# Patient Record
Sex: Female | Born: 1985 | Race: White | Hispanic: No | Marital: Single | Smoking: Current every day smoker
Health system: Southern US, Community
[De-identification: ages and names within clinical notes are randomized; demographics above are authoritative.]

## PROBLEM LIST (undated history)

## (undated) DIAGNOSIS — M797 Fibromyalgia: Secondary | ICD-10-CM

## (undated) DIAGNOSIS — J40 Bronchitis, not specified as acute or chronic: Secondary | ICD-10-CM

## (undated) HISTORY — PX: FACIAL FRACTURE SURGERY: SHX1570

---
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
## 2008-04-21 ENCOUNTER — Emergency Department (HOSPITAL_COMMUNITY): Admission: EM | Admit: 2008-04-21 | Discharge: 2008-04-21 | Payer: Self-pay | Admitting: Emergency Medicine

## 2008-12-06 ENCOUNTER — Emergency Department (HOSPITAL_COMMUNITY): Admission: EM | Admit: 2008-12-06 | Discharge: 2008-12-06 | Payer: Self-pay | Admitting: Emergency Medicine

## 2009-03-26 ENCOUNTER — Emergency Department (HOSPITAL_COMMUNITY): Admission: EM | Admit: 2009-03-26 | Discharge: 2009-03-26 | Payer: Self-pay | Admitting: Emergency Medicine

## 2009-03-28 ENCOUNTER — Emergency Department (HOSPITAL_COMMUNITY): Admission: EM | Admit: 2009-03-28 | Discharge: 2009-03-28 | Payer: Self-pay | Admitting: Emergency Medicine

## 2009-03-29 ENCOUNTER — Emergency Department (HOSPITAL_COMMUNITY): Admission: EM | Admit: 2009-03-29 | Discharge: 2009-03-29 | Payer: Self-pay | Admitting: Emergency Medicine

## 2009-07-30 ENCOUNTER — Emergency Department (HOSPITAL_COMMUNITY): Admission: EM | Admit: 2009-07-30 | Discharge: 2009-07-30 | Payer: Self-pay | Admitting: Emergency Medicine

## 2009-08-12 ENCOUNTER — Emergency Department (HOSPITAL_COMMUNITY): Admission: EM | Admit: 2009-08-12 | Discharge: 2009-08-12 | Payer: Self-pay | Admitting: Emergency Medicine

## 2009-10-08 ENCOUNTER — Emergency Department (HOSPITAL_COMMUNITY): Admission: EM | Admit: 2009-10-08 | Discharge: 2009-10-08 | Payer: Self-pay | Admitting: Emergency Medicine

## 2010-11-24 IMAGING — CR DG CHEST 2V
2 series · 2 of 2 positions shown · non-contrast
Comparison: None

CLINICAL DATA: Cough/chest pain the

CHEST - 2 VIEW

[w chest pa]
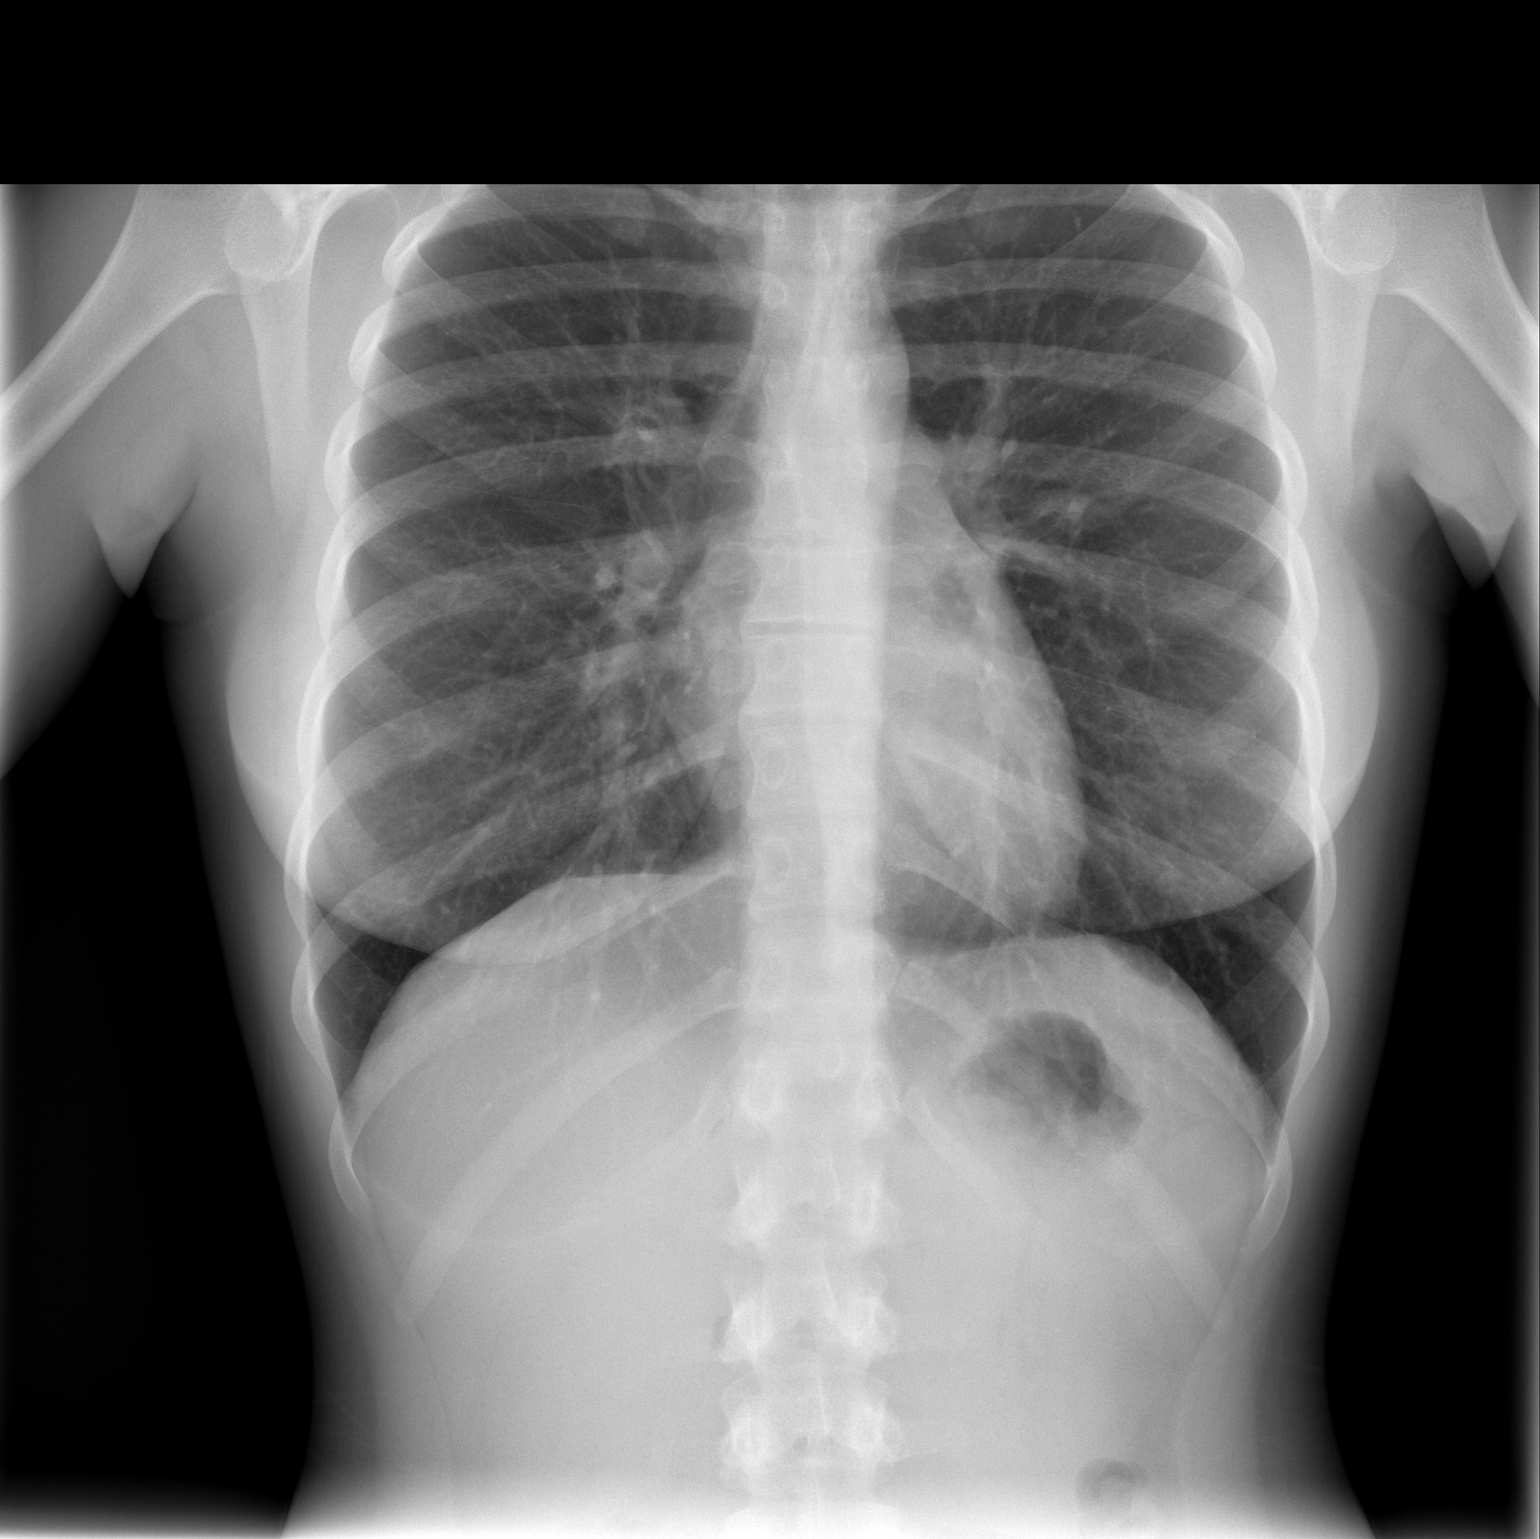

[w chest lat]
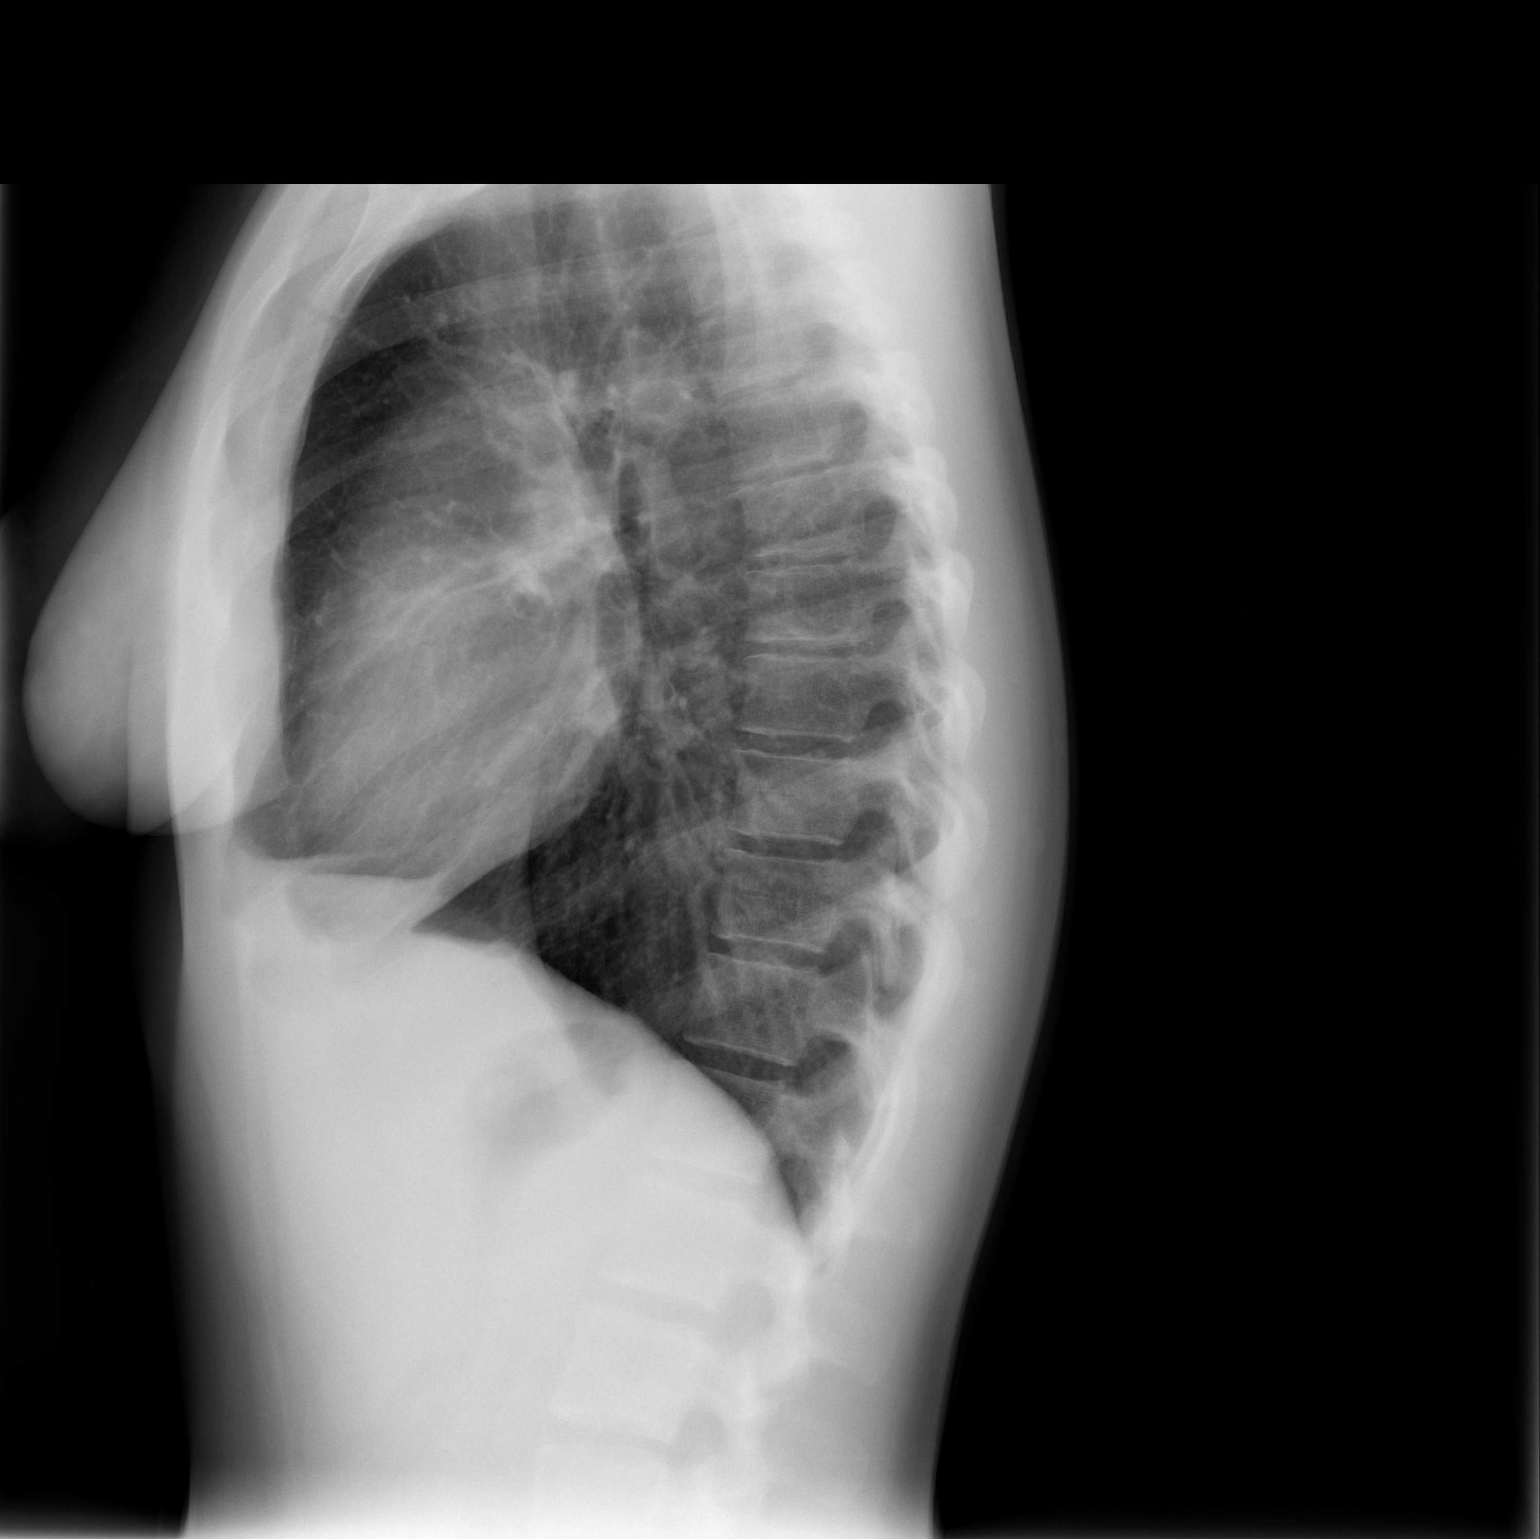

[2 of 2 positions shown; findings below may reference images not displayed]

FINDINGS: Heart and mediastinal contours normal.  Lungs clear.  No
pleural fluid.  Osseous structures and soft tissues unremarkable.
IMPRESSION: No acute or significant findings.

## 2010-12-09 ENCOUNTER — Emergency Department (HOSPITAL_COMMUNITY)
Admission: EM | Admit: 2010-12-09 | Discharge: 2010-12-09 | Payer: Self-pay | Source: Home / Self Care | Admitting: Emergency Medicine

## 2011-02-06 ENCOUNTER — Emergency Department (HOSPITAL_COMMUNITY)
Admission: EM | Admit: 2011-02-06 | Discharge: 2011-02-07 | Disposition: A | Discharge status: Other Acute Inpatient Hospital | Payer: Self-pay | Attending: Emergency Medicine | Admitting: Emergency Medicine

## 2011-02-06 DIAGNOSIS — F329 Major depressive disorder, single episode, unspecified: Secondary | ICD-10-CM | POA: Insufficient documentation

## 2011-02-06 DIAGNOSIS — F1910 Other psychoactive substance abuse, uncomplicated: Secondary | ICD-10-CM | POA: Insufficient documentation

## 2011-02-06 DIAGNOSIS — F3289 Other specified depressive episodes: Secondary | ICD-10-CM | POA: Insufficient documentation

## 2011-02-06 DIAGNOSIS — F1210 Cannabis abuse, uncomplicated: Secondary | ICD-10-CM | POA: Insufficient documentation

## 2011-02-07 ENCOUNTER — Inpatient Hospital Stay (HOSPITAL_COMMUNITY)
Admission: RE | Admit: 2011-02-07 | Discharge: 2011-02-10 | DRG: 885 | Disposition: A | Discharge status: Home / Self Care | Payer: PRIVATE HEALTH INSURANCE | Source: Ambulatory Visit | Attending: Psychiatry | Admitting: Psychiatry

## 2011-02-07 DIAGNOSIS — Z818 Family history of other mental and behavioral disorders: Secondary | ICD-10-CM

## 2011-02-07 DIAGNOSIS — J42 Unspecified chronic bronchitis: Secondary | ICD-10-CM

## 2011-02-07 DIAGNOSIS — F39 Unspecified mood [affective] disorder: Secondary | ICD-10-CM

## 2011-02-07 DIAGNOSIS — Z560 Unemployment, unspecified: Secondary | ICD-10-CM

## 2011-02-07 LAB — COMPREHENSIVE METABOLIC PANEL
ALT: 12 U/L (ref 0–35)
AST: 19 U/L (ref 0–37)
Albumin: 4.5 g/dL (ref 3.5–5.2)
CO2: 24 mEq/L (ref 19–32)
Calcium: 9.6 mg/dL (ref 8.4–10.5)
Chloride: 103 mEq/L (ref 96–112)
Creatinine, Ser: 0.82 mg/dL (ref 0.4–1.2)
GFR calc Af Amer: >60 mL/min (ref >60)
GFR calc non Af Amer: >60 mL/min (ref >60)
Sodium: 137 mEq/L (ref 135–145)

## 2011-02-07 LAB — CBC
Hemoglobin: 15 g/dL (ref 12.0–15.0)
MCHC: 34.2 g/dL (ref 30.0–36.0)
Platelets: 258 10*3/uL (ref 150–400)
RBC: 4.72 MIL/uL (ref 3.87–5.11)

## 2011-02-07 LAB — DIFFERENTIAL
Basophils Relative: 0 % (ref 0–1)
Eosinophils Absolute: 0.2 10*3/uL (ref 0.0–0.7)
Eosinophils Relative: 2 % (ref 0–5)
Lymphs Abs: 3.5 10*3/uL (ref 0.7–4.0)
Monocytes Absolute: 1.3 10*3/uL — ABNORMAL HIGH (ref 0.1–1.0)
Neutro Abs: 5.3 10*3/uL (ref 1.7–7.7)

## 2011-02-07 LAB — SALICYLATE LEVEL: Salicylate Lvl: <4 mg/dL (ref 2.8–20.0)

## 2011-02-07 LAB — RAPID URINE DRUG SCREEN, HOSP PERFORMED
Benzodiazepines: NOT DETECTED
Cocaine: NOT DETECTED
Opiates: POSITIVE — AB

## 2011-02-07 LAB — POCT PREGNANCY, URINE: Preg Test, Ur: NEGATIVE

## 2011-02-08 DIAGNOSIS — F39 Unspecified mood [affective] disorder: Secondary | ICD-10-CM

## 2011-02-08 DIAGNOSIS — F4320 Adjustment disorder, unspecified: Secondary | ICD-10-CM

## 2011-02-12 NOTE — Discharge Summary (Signed)
  NAME:  Terry Fields, Terry Fields           ACCOUNT NO.:  [id number]  MEDICAL RECORD NO.:  [id number]           PATIENT TYPE:  I  LOCATION:  0506                          [facility]  PHYSICIAN:  Marlis Edelson, DO        DATE OF BIRTH:  11/02/1986  DATE OF ADMISSION:  02/07/2011 DATE OF DISCHARGE:  02/10/2011                              DISCHARGE SUMMARY   REASON FOR ADMISSION:  This is a 25 year old female brought in to the emergency department by EMS.  The patient was sitting outside a friend's house without speaking for 5 hours.  The patient has had significant stressors.  FINAL DIAGNOSIS:  AXIS I:  Mood disorder NOS, adjustment disorder with depressed mood due to recent situational stressors. AXIS II:  Deferred. AXIS III:  Recent chronic bronchitis. AXIS IV:  Relationship issues, financial issues, occupation. AXIS V: Current is 55-60.  LABORATORY DATA:  Significant labs, urine drug screen positive for opiates and cannabis.  No measurable alcohol.  CBC was within normal limits.  The patient appeared her stated age.  SIGNIFICANT FINDINGS:  This is an alert and oriented female.  Speech within normal limits.  She seems somewhat irritable.  Denied any thoughts to hurt herself but reporting some passive suicidal thoughts. Her thought processes overall were clear and rational and goal oriented. Judgment and insight were fair.  Concentration and memory were intact. Intelligence was average.  PLAN:  Our plan was to admit her for safety stabilization, to get her connected with any possible resources.  We initiated Celexa to help with her anxiety and continue to assess her support and comorbidities.  Her affect was somewhat blunted.  Again, continued to endorse her stressors that she has.  She reported that she never tried to kill herself, that she just blacked out and was unable to recall what had happened.  The patient was tolerating her medications, but denied any suicidal thoughts.   She was anxious to leave.  She states she was never suicidal, it was just her life stressors.  On day of discharge, the patient was fully alert and cooperative, had a sense of humor, talked about a wide variety of subjects and was anxious to leave and get herself back on track.  She was in full contact with reality and showed no signs of hypomania, mania or overt anxiety.  DISCHARGE MEDICATIONS:  Celexa 20 mg 1/2 tablet b.i.d.  FOLLOWUP:  Her follow-up appointment was with Greig Right on Thursday March 15 at 9:30.  The patient was advised to go to the emergency room for any suicidal or homicidal thoughts or psychotic symptoms.     Landry Corporal, N.P.   ______________________________ Marlis Edelson, DO    JO/MEDQ  D:  02/11/2011  T:  02/11/2011  Job:  [id number]  Electronically Signed by Limmie PatriciaP. on 02/12/2011 09:16:39 AM Electronically Signed by Marlis Edelson MD on 02/12/2011 09:21:02 PM
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
## 2011-03-06 LAB — CULTURE, ROUTINE-ABSCESS
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
## 2011-08-12 ENCOUNTER — Encounter: Payer: Self-pay | Admitting: *Deleted

## 2011-08-12 ENCOUNTER — Emergency Department (HOSPITAL_BASED_OUTPATIENT_CLINIC_OR_DEPARTMENT_OTHER)
Admission: EM | Admit: 2011-08-12 | Discharge: 2011-08-12 | Disposition: A | Discharge status: Home / Self Care | Payer: Self-pay | Attending: Emergency Medicine | Admitting: Emergency Medicine

## 2011-08-12 DIAGNOSIS — K0889 Other specified disorders of teeth and supporting structures: Secondary | ICD-10-CM

## 2011-08-12 DIAGNOSIS — F17200 Nicotine dependence, unspecified, uncomplicated: Secondary | ICD-10-CM | POA: Insufficient documentation

## 2011-08-12 DIAGNOSIS — K089 Disorder of teeth and supporting structures, unspecified: Secondary | ICD-10-CM | POA: Insufficient documentation

## 2011-08-12 HISTORY — DX: Bronchitis, not specified as acute or chronic: J40

## 2011-08-12 MED ORDER — IBUPROFEN 800 MG PO TABS
800.0000 mg | ORAL_TABLET | Freq: Once | ORAL | Status: AC
  Administered 2011-08-12: 800 mg via ORAL
  Filled 2011-08-12: qty 1

## 2011-08-12 MED ORDER — OXYCODONE-ACETAMINOPHEN 5-325 MG PO TABS
2.0000 | ORAL_TABLET | Freq: Once | ORAL | Status: AC
  Administered 2011-08-12: 2 via ORAL
  Filled 2011-08-12: qty 2

## 2011-08-12 MED ORDER — PENICILLIN V POTASSIUM 500 MG PO TABS: 500.0000 mg | ORAL_TABLET | Freq: Four times a day (QID) | ORAL | Status: AC

## 2011-08-12 NOTE — ED Notes (Signed)
C/o upper/lower toothache x 2 weeks. Described as throbbing. States pain is radiating into eye/ear. Pt also here for possible ringworm to right forearm x 2 weeks. States appearance is worsening

## 2011-08-12 NOTE — ED Provider Notes (Signed)
History     CSN: [id number] Arrival date & time: 08/12/2011  7:28 PM Pt seen at 1941 Chief Complaint  Patient presents with  . Dental Pain   Patient is a 25 y.o. female presenting with tooth pain. The history is provided by the patient.  Dental PainThe primary symptoms include mouth pain. Primary symptoms do not include fever. The symptoms began more than 1 week ago. The symptoms are worsening. The symptoms occur constantly.  Additional symptoms do not include: trouble swallowing.  denies fever/chills/vomiting No recent dental injury Reports pain ever since having dental extraction yrs ago  Past Medical History  Diagnosis Date  . Bronchitis     History reviewed. No pertinent past surgical history.  No family history on file.  History  Substance Use Topics  . Smoking status: Current Everyday Smoker  . Smokeless tobacco: Not on file  . Alcohol Use: Yes    OB History    Grav Para Term Preterm Abortions TAB SAB Ect Mult Living                  Review of Systems  Constitutional: Negative for fever.  HENT: Negative for trouble swallowing.     Physical Exam  BP 119/84  Pulse 102  Temp(Src) 98.4 F (36.9 C) (Oral)  Resp 20  Ht 5\' 3"  (1.6 m)  Wt 140 lb (63.504 kg)  BMI 24.80 kg/m2  SpO2 100%  LMP 08/05/2011  Physical Exam  CONSTITUTIONAL: Well developed/well nourished HEAD AND FACE: Normocephalic/atraumatic EYES: EOMI/PERRL ENMT: Mucous membranes moist, no trismus ,floor of mouth soft Tender at site of previous right upper/lower molar (healed extraction site) no fluctuance or abscess noted NECK: supple no meningeal signs CV: S1/S2 noted, no murmurs/rubs/gallops noted LUNGS: Lungs are clear to auscultation bilaterally, no apparent distress ABDOMEN: soft, nontender, no rebound or guarding NEURO: Pt is awake/alert, moves all extremitiesx4 EXTREMITIES: pulses normal, full ROM    ED Course  Procedures  MDM Nursing notes reviewed and considered in  documentation       Joya Gaskins, MD 08/12/11 2025

## 2011-08-24 ENCOUNTER — Emergency Department (HOSPITAL_COMMUNITY)
Admission: EM | Admit: 2011-08-24 | Discharge: 2011-08-24 | Disposition: A | Discharge status: Home / Self Care | Payer: Self-pay | Attending: Emergency Medicine | Admitting: Emergency Medicine

## 2011-08-24 DIAGNOSIS — L298 Other pruritus: Secondary | ICD-10-CM | POA: Insufficient documentation

## 2011-08-24 DIAGNOSIS — L2989 Other pruritus: Secondary | ICD-10-CM | POA: Insufficient documentation

## 2011-08-24 DIAGNOSIS — R21 Rash and other nonspecific skin eruption: Secondary | ICD-10-CM | POA: Insufficient documentation

## 2011-08-24 DIAGNOSIS — K0381 Cracked tooth: Secondary | ICD-10-CM | POA: Insufficient documentation

## 2011-08-24 DIAGNOSIS — K089 Disorder of teeth and supporting structures, unspecified: Secondary | ICD-10-CM | POA: Insufficient documentation

## 2015-12-08 ENCOUNTER — Emergency Department (HOSPITAL_COMMUNITY)
Admission: EM | Admit: 2015-12-08 | Discharge: 2015-12-08 | Disposition: A | Discharge status: Home / Self Care | Payer: PRIVATE HEALTH INSURANCE | Attending: Emergency Medicine | Admitting: Emergency Medicine

## 2015-12-08 ENCOUNTER — Encounter (HOSPITAL_COMMUNITY): Payer: Self-pay | Admitting: *Deleted

## 2015-12-08 DIAGNOSIS — F17210 Nicotine dependence, cigarettes, uncomplicated: Secondary | ICD-10-CM | POA: Insufficient documentation

## 2015-12-08 DIAGNOSIS — J029 Acute pharyngitis, unspecified: Secondary | ICD-10-CM | POA: Insufficient documentation

## 2015-12-08 DIAGNOSIS — Z8739 Personal history of other diseases of the musculoskeletal system and connective tissue: Secondary | ICD-10-CM | POA: Insufficient documentation

## 2015-12-08 DIAGNOSIS — R05 Cough: Secondary | ICD-10-CM | POA: Insufficient documentation

## 2015-12-08 DIAGNOSIS — Z791 Long term (current) use of non-steroidal anti-inflammatories (NSAID): Secondary | ICD-10-CM | POA: Insufficient documentation

## 2015-12-08 DIAGNOSIS — R059 Cough, unspecified: Secondary | ICD-10-CM

## 2015-12-08 DIAGNOSIS — Z8709 Personal history of other diseases of the respiratory system: Secondary | ICD-10-CM | POA: Insufficient documentation

## 2015-12-08 HISTORY — DX: Fibromyalgia: M79.7

## 2015-12-08 MED ORDER — ACETAMINOPHEN-CODEINE #3 300-30 MG PO TABS
2.0000 | ORAL_TABLET | Freq: Once | ORAL | Status: AC
  Administered 2015-12-08: 2 via ORAL
  Filled 2015-12-08: qty 2

## 2015-12-08 MED ORDER — DEXAMETHASONE SODIUM PHOSPHATE 10 MG/ML IJ SOLN
10.0000 mg | Freq: Once | INTRAMUSCULAR | Status: AC
  Administered 2015-12-08: 10 mg via INTRAMUSCULAR
  Filled 2015-12-08: qty 1

## 2015-12-08 MED ORDER — IPRATROPIUM-ALBUTEROL 0.5-2.5 (3) MG/3ML IN SOLN
3.0000 mL | RESPIRATORY_TRACT | Status: DC
  Administered 2015-12-08: 3 mL via RESPIRATORY_TRACT
  Filled 2015-12-08: qty 3

## 2015-12-08 MED ORDER — HYDROCODONE-HOMATROPINE 5-1.5 MG/5ML PO SYRP: 5.0000 mL | ORAL_SOLUTION | Freq: Two times a day (BID) | ORAL | Status: DC | PRN

## 2015-12-08 MED ORDER — BENZONATATE 100 MG PO CAPS
200.0000 mg | ORAL_CAPSULE | Freq: Once | ORAL | Status: AC
  Administered 2015-12-08: 200 mg via ORAL
  Filled 2015-12-08: qty 2

## 2015-12-08 NOTE — Discharge Instructions (Signed)
Cough, Adult Ms. Terry Fields, take hycodan syrup for some cough relief.  See a primary care doctor within 3 days for close follow up.  If symptoms worsen, come back to the ED immediately. Thank you. A cough helps to clear your throat and lungs. A cough may last only 2-3 weeks (acute), or it may last longer than 8 weeks (chronic). Many different things can cause a cough. A cough may be a sign of an illness or another medical condition. HOME CARE  Pay attention to any changes in your cough.  Take medicines only as told by your doctor.  If you were prescribed an antibiotic medicine, take it as told by your doctor. Do not stop taking it even if you start to feel better.  Talk with your doctor before you try using a cough medicine.  Drink enough fluid to keep your pee (urine) clear or pale yellow.  If the air is dry, use a cold steam vaporizer or humidifier in your home.  Stay away from things that make you cough at work or at home.  If your cough is worse at night, try using extra pillows to raise your head up higher while you sleep.  Do not smoke, and try not to be around smoke. If you need help quitting, ask your doctor.  Do not have caffeine.  Do not drink alcohol.  Rest as needed. GET HELP IF:  You have new problems (symptoms).  You cough up yellow fluid (pus).  Your cough does not get better after 2-3 weeks, or your cough gets worse.  Medicine does not help your cough and you are not sleeping well.  You have pain that gets worse or pain that is not helped with medicine.  You have a fever.  You are losing weight and you do not know why.  You have night sweats. GET HELP RIGHT AWAY IF:  You cough up blood.  You have trouble breathing.  Your heartbeat is very fast.   This information is not intended to replace advice given to you by your health care provider. Make sure you discuss any questions you have with your health care provider.   Document Released: 07/30/2011  Document Revised: 08/07/2015 Document Reviewed: 01/23/2015 Elsevier Interactive Patient Education [employer]2016 Elsevier Inc.

## 2015-12-08 NOTE — ED Notes (Signed)
Pt reports a cough x1 week, progressively worse and constant cough x3 days - pt reports non-productive cough for the past few days. Pt admits to hx of chronic bronchitis and "just getting over the flu a few weeks ago" - pt has been taking taking flexeril, amoxicillin, tessalon pearls and OTC medications w/o relief. Pt reports ribs are sore while coughing. Denies fever.

## 2015-12-08 NOTE — ED Provider Notes (Signed)
CSN: [id number]     Arrival date & time 12/08/15  0329 History   First MD Initiated Contact with Patient 12/08/15 [phone]     Chief Complaint  Patient presents with  . Cough     (Consider location/radiation/quality/duration/timing/severity/associated sxs/prior Treatment) HPI  Terry Fields is a 30 y.o. female with past medical history of cigarette abuse presenting today with cough. She states she had the flu 3 weeks ago. She is subsequently had a nonproductive cough for the past week. She states the cough is starting to hurt her ribs and the causes a sore throat. She has been taking Flexeril, amoxicillin, Tessalon Perles and Robitussin without any relief. She denies any fever. She denies any viral URI symptoms such as rhinorrhea, congestion, or sneezing. She states she can't sleep at night and is here to get rid of the cough. There are no further complaints.  10 Systems reviewed and are negative for acute change except as noted in the HPI.      Past Medical History  Diagnosis Date  . Bronchitis   . Fibromyalgia    Past Surgical History  Procedure Laterality Date  . Facial fracture surgery     History reviewed. No pertinent family history. Social History  Substance Use Topics  . Smoking status: Current Every Day Smoker    Types: Cigarettes  . Smokeless tobacco: None  . Alcohol Use: Yes   OB History    No data available     Review of Systems    Allergies  Review of patient's allergies indicates no known allergies.  Home Medications   Prior to Admission medications   Medication Sig Start Date End Date Taking? Authorizing Provider  acetaminophen (TYLENOL) 500 MG tablet Take 1,000-1,500 mg by mouth every 6 (six) hours as needed. pain     Historical Provider, MD  ibuprofen (ADVIL,MOTRIN) 200 MG tablet Take 800 mg by mouth every 6 (six) hours as needed. pain     Historical Provider, MD  Multiple Vitamins-Minerals (ADULT GUMMY PO) Take 2 tablets by mouth daily.       Historical Provider, MD  naproxen sodium (ANAPROX) 220 MG tablet Take 440 mg by mouth once.      Historical Provider, MD   BP 127/94 mmHg  Pulse 119  Temp(Src) 98.6 F (37 C)  Resp 22  Ht 5\' 3"  (1.6 m)  Wt 148 lb (67.132 kg)  BMI 26.22 kg/m2  SpO2 99%  LMP 11/24/2015 (Approximate) Physical Exam  Constitutional: She is oriented to person, place, and time. She appears well-developed and well-nourished. No distress.  Frequent coughing on examination.  HENT:  Head: Normocephalic and atraumatic.  Nose: Nose normal.  Mouth/Throat: Oropharynx is clear and moist. No oropharyngeal exudate.  Eyes: Conjunctivae and EOM are normal. Pupils are equal, round, and reactive to light. No scleral icterus.  Neck: Normal range of motion. Neck supple. No JVD present. No tracheal deviation present. No thyromegaly present.  Cardiovascular: Normal rate, regular rhythm and normal heart sounds.  Exam reveals no gallop and no friction rub.   No murmur heard. Pulmonary/Chest: Effort normal and breath sounds normal. No respiratory distress. She has no wheezes. She exhibits no tenderness.  Abdominal: Soft. Bowel sounds are normal. She exhibits no distension and no mass. There is no tenderness. There is no rebound and no guarding.  Musculoskeletal: Normal range of motion. She exhibits no edema or tenderness.  Lymphadenopathy:    She has no cervical adenopathy.  Neurological: She is alert and oriented to person,  place, and time. No cranial nerve deficit. She exhibits normal muscle tone.  Skin: Skin is warm and dry. No rash noted. No erythema. No pallor.  Nursing note and vitals reviewed.   ED Course  Procedures (including critical care time) Labs Review Labs Reviewed - No data to display  Imaging Review No results found. I have personally reviewed and evaluated these images and lab results as part of my medical decision-making.   EKG Interpretation None      MDM   Final diagnoses:  None     Patient presents to emergency department for post viral cough for 1 week. There is no productive component, no fever, do not believe chest x-ray is warranted. She is using LawyerTessalon Perles without any relief. She is only using 100 mg every 8 hours, she was given a 200 mg tablet emergency department. Also given breathing treatment and Decadron and Tylenol 3 with codeine for relief.  Upon repeat evaluation, her pain has improved as well as her cough. We'll discharge with hycodan. Advised to follow-up with her primary care for further management. She appears well-developed distress, vital signs were within her normal limits, tachycardia has resolved on its own, patient is safe for discharge.  Tomasita CrumbleAdeleke Raelene Trew, MD 12/08/15 0430

## 2015-12-08 NOTE — ED Notes (Signed)
Pt ambulating independently w/ steady gait on d/c in no acute distress, A&Ox4. D/c instructions reviewed w/ pt - pt denies any further questions or concerns at present. Rx given x1  

## 2017-04-25 ENCOUNTER — Encounter (HOSPITAL_COMMUNITY): Payer: Self-pay | Admitting: Emergency Medicine

## 2017-04-25 ENCOUNTER — Emergency Department (HOSPITAL_COMMUNITY)
Admission: EM | Admit: 2017-04-25 | Discharge: 2017-04-25 | Disposition: A | Discharge status: Home / Self Care | Payer: Self-pay | Attending: Emergency Medicine | Admitting: Emergency Medicine

## 2017-04-25 DIAGNOSIS — Y929 Unspecified place or not applicable: Secondary | ICD-10-CM | POA: Insufficient documentation

## 2017-04-25 DIAGNOSIS — Y939 Activity, unspecified: Secondary | ICD-10-CM | POA: Insufficient documentation

## 2017-04-25 DIAGNOSIS — F17210 Nicotine dependence, cigarettes, uncomplicated: Secondary | ICD-10-CM | POA: Insufficient documentation

## 2017-04-25 DIAGNOSIS — X58XXXA Exposure to other specified factors, initial encounter: Secondary | ICD-10-CM | POA: Insufficient documentation

## 2017-04-25 DIAGNOSIS — Z79899 Other long term (current) drug therapy: Secondary | ICD-10-CM | POA: Insufficient documentation

## 2017-04-25 DIAGNOSIS — S0501XA Injury of conjunctiva and corneal abrasion without foreign body, right eye, initial encounter: Secondary | ICD-10-CM

## 2017-04-25 DIAGNOSIS — Y999 Unspecified external cause status: Secondary | ICD-10-CM | POA: Insufficient documentation

## 2017-04-25 MED ORDER — HYDROCODONE-ACETAMINOPHEN 5-325 MG PO TABS
1.0000 | ORAL_TABLET | Freq: Once | ORAL | Status: AC
  Administered 2017-04-25: 1 via ORAL
  Filled 2017-04-25: qty 1

## 2017-04-25 MED ORDER — TETRACAINE HCL 0.5 % OP SOLN
1.0000 [drp] | Freq: Once | OPHTHALMIC | Status: AC
  Administered 2017-04-25: 1 [drp] via OPHTHALMIC
  Filled 2017-04-25: qty 2

## 2017-04-25 MED ORDER — ERYTHROMYCIN 5 MG/GM OP OINT: TOPICAL_OINTMENT | OPHTHALMIC | 0 refills | Status: DC

## 2017-04-25 MED ORDER — FLUORESCEIN SODIUM 0.6 MG OP STRP
1.0000 | ORAL_STRIP | Freq: Once | OPHTHALMIC | Status: AC
  Administered 2017-04-25: 1 via OPHTHALMIC
  Filled 2017-04-25: qty 1

## 2017-04-25 NOTE — ED Notes (Signed)
Pt staets she understands instructions. Home stable with steady gait. 

## 2017-04-25 NOTE — ED Provider Notes (Signed)
[facility]-EMERGENCY DEPT Provider Note   CSN: [id number] Arrival date & time: 04/25/17  0441     History   Chief Complaint Chief Complaint  Patient presents with  . Eye Problem    HPI Arlyss Represslyssa Senaida OresRichardson is a 31 y.o. female.  The history is provided by medical records. No language interpreter was used.   Delmer Islamlyssa Pangilinan is a 31 y.o. female  with a PMH of fibromyalgia who presents to the Emergency Department complaining of intermittent right eye pain and redness x 1 week. When she awoke around 3am this morning, pain was much worse than usual. Associated with blurred vision and watery discharge just out of the right eye. Denies foreign body sensation and does not believe she got anything into the eye. No fevers, chills, cough, congestion. No alleviating or aggravating factors noted. She also is complaining of pain to the top, back right teeth. No facial swelling, trouble swallowing or trouble breathing.   Past Medical History:  Diagnosis Date  . Bronchitis   . Fibromyalgia     There are no active problems to display for this patient.   Past Surgical History:  Procedure Laterality Date  . FACIAL FRACTURE SURGERY      OB History    No data available       Home Medications    Prior to Admission medications   Medication Sig Start Date End Date Taking? Authorizing Provider  acetaminophen (TYLENOL) 500 MG tablet Take 1,000-1,500 mg by mouth every 6 (six) hours as needed. pain     [provider]  erythromycin ophthalmic ointment Place a 1/2 inch ribbon of ointment into the lower eyelid four times daily. 04/25/17   Shaletha Humble, Chase PicketJaime Pilcher, PA-C  HYDROcodone-homatropine [facility](HYCODAN) 5-1.5 MG/5ML syrup Take 5 mLs by mouth 2 (two) times daily as needed for cough. 12/08/15   Tomasita Crumbleni, Adeleke, MD  ibuprofen (ADVIL,MOTRIN) 200 MG tablet Take 800 mg by mouth every 6 (six) hours as needed. pain     [provider]  Multiple Vitamins-Minerals (ADULT GUMMY PO) Take 2 tablets by mouth  daily.      [provider]  naproxen sodium (ANAPROX) 220 MG tablet Take 440 mg by mouth once.      [provider]    Family History No family history on file.  Social History Social History  Substance Use Topics  . Smoking status: Current Every Day Smoker    Types: Cigarettes  . Smokeless tobacco: Never Used  . Alcohol use Yes     Allergies   Patient has no known allergies.   Review of Systems Review of Systems  Constitutional: Negative for chills and fever.  HENT: Positive for dental problem. Negative for congestion, ear pain and sore throat.   Eyes: Positive for pain, discharge and redness. Negative for itching.     Physical Exam Updated Vital Signs BP (!) 127/96 (BP Location: Left Arm)   Pulse 78   Temp 98.7 F (37.1 C) (Oral)   Resp 16   Ht 5\' 3"  (1.6 m)   Wt 66.2 kg (146 lb)   LMP 04/11/2017 (Approximate)   SpO2 99%   BMI 25.86 kg/m   Physical Exam  Constitutional: She appears well-developed and well-nourished. No distress.  HENT:  Head: Normocephalic and atraumatic.  No periorbital edema or erythema.  Eyes:  Mildly injected right conjunctiva. Right eye with clear discharge. EOMI, PERRL. Peripheral vision intact. Fluorescein uptake consistent with abrasion. IOP 14 R, 17 L  Neck: Neck supple.  Cardiovascular:  Normal rate, regular rhythm and normal heart sounds.   No murmur heard. Pulmonary/Chest: Effort normal and breath sounds normal. No respiratory distress. She has no wheezes. She has no rales.  Neurological: She is alert.  Skin: Skin is warm and dry.  Nursing note and vitals reviewed.    ED Treatments / Results  Labs (all labs ordered are listed, but only abnormal results are displayed) Labs Reviewed - No data to display  EKG  EKG Interpretation None       Radiology No results found.  Procedures Procedures (including critical care time)  Medications Ordered in ED Medications  HYDROcodone-acetaminophen  (NORCO/VICODIN) 5-325 MG per tablet 1 tablet (not administered)  tetracaine (PONTOCAINE) 0.5 % ophthalmic solution 1 drop (1 drop Right Eye Given 04/25/17 0626)  fluorescein ophthalmic strip 1 strip (1 strip Right Eye Given 04/25/17 4098)     Initial Impression / Assessment and Plan / ED Course  I have reviewed the triage vital signs and the nursing notes.  Pertinent labs & imaging results that were available during my care of the patient were reviewed by me and considered in my medical decision making (see chart for details).    Sami Roes is a 31 y.o. female who presents to ED for eye pain / redness / drainage x 1 week. IOP normal. Very small abrasion on fluorescein exam. No periorbital edema or erythema to suggest orbital/periorbital cellulitis. Normal visual acuity. No consensual photophobia. Will treat with erythromycin ointment and have patient follow-up with ophthalmology. Reasons to return to ER discussed and all questions answered.  Patient discussed with Dr. Wilkie Aye who agrees with treatment plan.    Final Clinical Impressions(s) / ED Diagnoses   Final diagnoses:  Abrasion of right cornea, initial encounter    New Prescriptions New Prescriptions   ERYTHROMYCIN OPHTHALMIC OINTMENT    Place a 1/2 inch ribbon of ointment into the lower eyelid four times daily.       Essense Bousquet, Chase Picket, PA-C 04/25/17 0715    Shon Baton, MD 04/25/17 [phone]

## 2017-04-25 NOTE — Discharge Instructions (Signed)
It was my pleasure taking care of you today!   You will need to call the eye doctor as soon as possible to schedule a follow up visit.  [facility] [phone]  Use antibiotic ointment as directed. Ice for additional pain relief.   Return to ER for new or worsening symptoms, any additional concerns.

## 2017-04-25 NOTE — ED Triage Notes (Signed)
Patient reports persistent right eye pain with redness, blurred vision and lacrimation onset last week , denies eye injury.

## 2017-07-05 ENCOUNTER — Emergency Department (HOSPITAL_COMMUNITY)
Admission: EM | Admit: 2017-07-05 | Discharge: 2017-07-05 | Disposition: A | Discharge status: Home / Self Care | Payer: Self-pay | Attending: Emergency Medicine | Admitting: Emergency Medicine

## 2017-07-05 ENCOUNTER — Encounter (HOSPITAL_COMMUNITY): Payer: Self-pay | Admitting: Emergency Medicine

## 2017-07-05 DIAGNOSIS — F17210 Nicotine dependence, cigarettes, uncomplicated: Secondary | ICD-10-CM | POA: Insufficient documentation

## 2017-07-05 DIAGNOSIS — R600 Localized edema: Secondary | ICD-10-CM | POA: Insufficient documentation

## 2017-07-05 DIAGNOSIS — Z79899 Other long term (current) drug therapy: Secondary | ICD-10-CM | POA: Insufficient documentation

## 2017-07-05 DIAGNOSIS — K0889 Other specified disorders of teeth and supporting structures: Secondary | ICD-10-CM | POA: Insufficient documentation

## 2017-07-05 MED ORDER — CLINDAMYCIN HCL 150 MG PO CAPS
300.0000 mg | ORAL_CAPSULE | Freq: Once | ORAL | Status: AC
  Administered 2017-07-05: 300 mg via ORAL
  Filled 2017-07-05: qty 2

## 2017-07-05 MED ORDER — CLINDAMYCIN HCL 150 MG PO CAPS: 300.0000 mg | ORAL_CAPSULE | Freq: Three times a day (TID) | ORAL | 0 refills | Status: DC

## 2017-07-05 NOTE — ED Triage Notes (Signed)
Pt in from home c/o R upper dental pain, R facial swelling x 2 mo's. Pt states she's been unable to see a dentist, has dealt with R upper molar abcess for some time, was on antibiotics with no reported relief. Swelling is worsening. Pt states her R peripheral vision has worsened some

## 2017-07-05 NOTE — ED Provider Notes (Signed)
[facility]PT Provider Note   CSN: [id number] Arrival date & time: 07/05/17  0704     History   Chief Complaint Chief Complaint  Patient presents with  . Facial Swelling  . Dental Pain    HPI Terry Fields is a 31 y.o. female.  The history is provided by the patient and medical records.  Dental Pain       31 year old female with history of bronchitis, fibromyalgia, presenting to the ED for right upper dental pain. Patient reports she has had intermittent dental abscesses for the past few months. She has been treated with about 3 courses of amoxicillin without resolution of infection. States intermittently she has right-sided facial swelling extending up into the eye. States when this happened she feels like her vision is "off". States usually worse in the morning and swelling improves throughout the day.  States she has not been able to follow-up with dentist as she does not have any current dental insurance and cannot afford out-of-pocket cost.  She denies fever, chills, sweats.  No neck swelling or trouble swallowing.  Denies vision changes at present.  No pain with EOMs of the eye.  Past Medical History:  Diagnosis Date  . Bronchitis   . Fibromyalgia     There are no active problems to display for this patient.   Past Surgical History:  Procedure Laterality Date  . FACIAL FRACTURE SURGERY      OB History    No data available       Home Medications    Prior to Admission medications   Medication Sig Start Date End Date Taking? Authorizing Provider  acetaminophen (TYLENOL) 500 MG tablet Take 1,000-1,500 mg by mouth every 6 (six) hours as needed. pain     [provider]  erythromycin ophthalmic ointment Place a 1/2 inch ribbon of ointment into the lower eyelid four times daily. 04/25/17   Ward, Chase PicketJaime Pilcher, PA-C  HYDROcodone-homatropine [facility](HYCODAN) 5-1.5 MG/5ML syrup Take 5 mLs by mouth 2 (two) times daily as needed for cough. 12/08/15   Tomasita Crumbleni,  Adeleke, MD  ibuprofen (ADVIL,MOTRIN) 200 MG tablet Take 800 mg by mouth every 6 (six) hours as needed. pain     [provider]  Multiple Vitamins-Minerals (ADULT GUMMY PO) Take 2 tablets by mouth daily.      [provider]  naproxen sodium (ANAPROX) 220 MG tablet Take 440 mg by mouth once.      [provider]    Family History No family history on file.  Social History Social History  Substance Use Topics  . Smoking status: Current Every Day Smoker    Types: Cigarettes  . Smokeless tobacco: Never Used  . Alcohol use Yes     Allergies   Patient has no known allergies.   Review of Systems Review of Systems  HENT: Positive for dental problem.   All other systems reviewed and are negative.    Physical Exam Updated Vital Signs BP 110/80   Pulse 77   Temp 98.4 F (36.9 C) (Oral)   Resp 17   Ht 5\' 1"  (1.549 m)   Wt 66.2 kg (146 lb)   SpO2 100%   BMI 27.59 kg/m   Physical Exam  Constitutional: She is oriented to person, place, and time. She appears well-developed and well-nourished.  HENT:  Head: Normocephalic and atraumatic.  Mouth/Throat: Oropharynx is clear and moist.  Teeth largely in fair dentition, left upper and lower molars are broken and severely decayed, surrounding gingiva  mildly swollen and irriated, handling secretions appropriately, no trismus or malocclusion, no facial or neck swelling, normal phonation without stridor  Eyes: Pupils are equal, round, and reactive to light. Conjunctivae, EOM and lids are normal. Right conjunctiva is not injected.  No swelling or erythema of left eyelids; EOMs fully intact and non-painful; no field cuts, normal confrontation  Neck: Normal range of motion.  Cardiovascular: Normal rate, regular rhythm and normal heart sounds.   Pulmonary/Chest: Effort normal and breath sounds normal.  Abdominal: Soft. Bowel sounds are normal.  Musculoskeletal: Normal range of motion.  Neurological: She is alert  and oriented to person, place, and time.  Skin: Skin is warm and dry.  Psychiatric: She has a normal mood and affect.  Nursing note and vitals reviewed.    ED Treatments / Results  Labs (all labs ordered are listed, but only abnormal results are displayed) Labs Reviewed - No data to display  EKG  EKG Interpretation None       Radiology No results found.  Procedures Procedures (including critical care time)  Medications Ordered in ED Medications  clindamycin (CLEOCIN) capsule 300 mg (not administered)     Initial Impression / Assessment and Plan / ED Course  I have reviewed the triage vital signs and the nursing notes.  Pertinent labs & imaging results that were available during my care of the patient were reviewed by me and considered in my medical decision making (see chart for details).  31 year old female here with right upper dental pain. Has history of intermittent abscesses over the past month.  States she has had some mild swelling of the right eye, none currently.  She did express concern that her dental infection may spread to her eye. On exam she does have overall poor dentition with severe decay of her right upper and lower molars. She has some mild swelling and irritation of the gingiva but no significant facial or neck swelling. She is handling her secretions well. She has no visible swelling of the right eye, no erythema. Her EOMs are fully intact and nonpainful. She does not have any apparent field cuts, confrontation is normal. At this time I do not appreciate any signs or symptoms concerning for orbital cellulitis or disseminated infection. She's been treated a few times with penicillin without much improvement, will start trial of clindamycin. She will be referred to dentist on call for follow-up.  She understands to return here for any new/worsening symptoms.  Patient discharged home in stable condition.  Final Clinical Impressions(s) / ED Diagnoses   Final  diagnoses:  Pain, dental    New Prescriptions New Prescriptions   CLINDAMYCIN (CLEOCIN) 150 MG CAPSULE    Take 2 capsules (300 mg total) by mouth 3 (three) times daily. May dispense as 150mg  capsules     Garlon Hatchet, PA-C 07/05/17 1610    Rolan Bucco, MD 07/05/17 [phone]

## 2017-07-05 NOTE — Discharge Instructions (Signed)
Take the prescribed medication as directed. Follow-up with Dr. Michiel SitesKoelling-- he is dentist on call today.  Can call his office to make  an appt.  If for some reason this does not work out, see the list of clinics attached on back to help with follow-up. Return to the ED for new or worsening symptoms.

## 2018-08-19 ENCOUNTER — Encounter (HOSPITAL_COMMUNITY): Payer: Self-pay | Admitting: Emergency Medicine

## 2018-08-19 ENCOUNTER — Emergency Department (HOSPITAL_COMMUNITY): Payer: Self-pay

## 2018-08-19 ENCOUNTER — Emergency Department (HOSPITAL_COMMUNITY)
Admission: EM | Admit: 2018-08-19 | Discharge: 2018-08-20 | Disposition: A | Discharge status: Home / Self Care | Payer: Self-pay | Attending: Emergency Medicine | Admitting: Emergency Medicine

## 2018-08-19 ENCOUNTER — Other Ambulatory Visit: Payer: Self-pay

## 2018-08-19 DIAGNOSIS — Y939 Activity, unspecified: Secondary | ICD-10-CM | POA: Insufficient documentation

## 2018-08-19 DIAGNOSIS — Y929 Unspecified place or not applicable: Secondary | ICD-10-CM | POA: Insufficient documentation

## 2018-08-19 DIAGNOSIS — S01511A Laceration without foreign body of lip, initial encounter: Secondary | ICD-10-CM | POA: Insufficient documentation

## 2018-08-19 DIAGNOSIS — Z79899 Other long term (current) drug therapy: Secondary | ICD-10-CM | POA: Insufficient documentation

## 2018-08-19 DIAGNOSIS — F17210 Nicotine dependence, cigarettes, uncomplicated: Secondary | ICD-10-CM | POA: Insufficient documentation

## 2018-08-19 DIAGNOSIS — Y999 Unspecified external cause status: Secondary | ICD-10-CM | POA: Insufficient documentation

## 2018-08-19 DIAGNOSIS — W228XXA Striking against or struck by other objects, initial encounter: Secondary | ICD-10-CM | POA: Insufficient documentation

## 2018-08-19 MED ORDER — ONDANSETRON 4 MG PO TBDP
4.0000 mg | ORAL_TABLET | Freq: Once | ORAL | Status: AC
  Administered 2018-08-19: 4 mg via ORAL
  Filled 2018-08-19: qty 1

## 2018-08-19 MED ORDER — LIDOCAINE HCL (PF) 1 % IJ SOLN
5.0000 mL | Freq: Once | INTRAMUSCULAR | Status: AC
  Administered 2018-08-19: 5 mL
  Filled 2018-08-19: qty 5

## 2018-08-19 MED ORDER — OXYCODONE-ACETAMINOPHEN 5-325 MG PO TABS
1.0000 | ORAL_TABLET | Freq: Once | ORAL | Status: AC
  Administered 2018-08-19: 1 via ORAL
  Filled 2018-08-19: qty 1

## 2018-08-19 NOTE — ED Notes (Signed)
Pt came to nurses station asking about wait time to see provider, pt upset about wait time.

## 2018-08-19 NOTE — ED Notes (Signed)
ED Provider at bedside. 

## 2018-08-19 NOTE — ED Provider Notes (Addendum)
[facility] EMERGENCY DEPARTMENT Provider Note   CSN: [id number] Arrival date & time: 08/19/18  9147     History   Chief Complaint Chief Complaint  Patient presents with  . Lip Laceration    HPI Terry Fields is a 32 y.o. female presenting for facial laceration that occurred approximately 8 PM this evening.  Patient was using a table saw when the wood kicked back and hit her in the lip.  Patient with laceration to upper lip and inside of lip.  Patient describes pain as severe, constant and throbbing in nature.  Patient seen by quick look provider and CT maxillofacial was ordered.  Patient given pain control prior to my evaluation, states decrease in pain with Percocet.  Patient denies loss of consciousness, blood thinner use, nausea/vomiting or dizziness.  Patient denies headache.  Patient states the pain is only around where the laceration has occurred.  HPI  Past Medical History:  Diagnosis Date  . Bronchitis   . Fibromyalgia     There are no active problems to display for this patient.   Past Surgical History:  Procedure Laterality Date  . FACIAL FRACTURE SURGERY       OB History   None      Home Medications    Prior to Admission medications   Medication Sig Start Date End Date Taking? Authorizing Provider  acetaminophen (TYLENOL) 500 MG tablet Take 1,000-1,500 mg by mouth every 6 (six) hours as needed. pain     [provider]  clindamycin (CLEOCIN) 150 MG capsule Take 2 capsules (300 mg total) by mouth 3 (three) times daily. May dispense as 150mg  capsules 07/05/17   Garlon Hatchet, PA-C  erythromycin ophthalmic ointment Place a 1/2 inch ribbon of ointment into the lower eyelid four times daily. 04/25/17   Ward, Chase Picket, PA-C  HYDROcodone-homatropine [facility]) 5-1.5 MG/5ML syrup Take 5 mLs by mouth 2 (two) times daily as needed for cough. 12/08/15   Tomasita Crumble, MD  ibuprofen (ADVIL,MOTRIN) 200 MG tablet Take 800 mg by mouth  every 6 (six) hours as needed. pain     [provider]  Multiple Vitamins-Minerals (ADULT GUMMY PO) Take 2 tablets by mouth daily.      [provider]  naproxen sodium (ANAPROX) 220 MG tablet Take 440 mg by mouth once.      [provider]    Family History No family history on file.  Social History Social History   Tobacco Use  . Smoking status: Current Every Day Smoker    Types: Cigarettes  . Smokeless tobacco: Never Used  Substance Use Topics  . Alcohol use: Yes  . Drug use: Yes    Types: Marijuana     Allergies   Patient has no known allergies.   Review of Systems Review of Systems  Constitutional: Negative.  Negative for chills, fatigue and fever.  HENT: Positive for facial swelling. Negative for congestion, rhinorrhea, sore throat, trouble swallowing and voice change.   Gastrointestinal: Negative.  Negative for nausea and vomiting.  Skin: Positive for wound.  Neurological: Negative.  Negative for dizziness, syncope, weakness, numbness and headaches.     Physical Exam Updated Vital Signs BP 109/80 (BP Location: Right Arm)   Pulse 85   Temp 99.5 F (37.5 C)   Resp 16   Ht 5' 3.5" (1.613 m)   Wt 65.8 kg   LMP 08/15/2018   SpO2 98%   BMI 25.28 kg/m   Physical Exam  Constitutional:  She appears well-developed and well-nourished. No distress.  HENT:  Head: Normocephalic.  Right Ear: External ear normal.  Left Ear: External ear normal.  Nose: Nose normal.  Mouth/Throat: Uvula is midline and oropharynx is clear and moist. No trismus in the jaw. Lacerations present. No dental abscesses or uvula swelling.    1.5 cm laceration to left side of upper lip involving a small amount of vermilion border.  Some active bleeding. Wound with slight gape.  Small 3 mm laceration to inside of left upper lip around canine tooth.  No active bleeding.  Lacerations fully explored in bloodless field, they do not connect.  Lacerations are not through  and through.  Eyes: Pupils are equal, round, and reactive to light. EOM are normal.  Neck: Trachea normal and normal range of motion. No tracheal deviation present.  Pulmonary/Chest: Effort normal. No respiratory distress.  Abdominal: Soft. There is no tenderness. There is no rebound and no guarding.  Musculoskeletal: Normal range of motion.  Neurological: She is alert. She has normal strength. No sensory deficit. GCS eye subscore is 4. GCS verbal subscore is 5. GCS motor subscore is 6.  Speech is clear and goal oriented, follows commands Major Cranial nerves without deficit, no facial droop Normal strength in upper and lower extremities bilaterally including dorsiflexion and plantar flexion, strong and equal grip strength Sensation normal to light and sharp touch Moves extremities without ataxia, coordination intact Normal gait    Skin: Skin is warm and dry.  Psychiatric: She has a normal mood and affect. Her behavior is normal.     ED Treatments / Results  Labs (all labs ordered are listed, but only abnormal results are displayed) Labs Reviewed - No data to display  EKG None  Radiology Ct Maxillofacial Wo Contrast  Result Date: 08/19/2018 CLINICAL DATA:  Facial trauma.  Laceration to left lip. EXAM: CT MAXILLOFACIAL WITHOUT CONTRAST TECHNIQUE: Multidetector CT imaging of the maxillofacial structures was performed. Multiplanar CT image reconstructions were also generated. COMPARISON:  None. FINDINGS: Osseous: No fracture or mandibular dislocation. No destructive process. Orbits: Negative. No traumatic or inflammatory finding. Sinuses: Clear. Status post ORIF left inferior orbital rim. 1 of the screws of the fixation plate has backed out and is in the subcutaneous tissues. Soft tissues: Some swelling is evident in the upper lip. Limited intracranial: No significant or unexpected finding. IMPRESSION: 1. No acute maxillofacial bony injury. 2. 1 of the fixation screws of the left inferior  orbital plate has backed out of the bone. Electronically Signed   By: Kennith CenterEric  Mansell M.D.   On: 08/19/2018 20:45    Procedures .Marland Kitchen.Laceration Repair Date/Time: 08/20/2018 12:07 AM Performed by: Bill SalinasMorelli, Dontavion Noxon A, PA-C Authorized by: Bill SalinasMorelli, Sahib Pella A, PA-C   Consent:    Consent obtained:  Verbal   Consent given by:  Patient   Risks discussed:  Infection, poor cosmetic result, pain, retained foreign body, tendon damage, vascular damage, poor wound healing, nerve damage and need for additional repair Anesthesia (see MAR for exact dosages):    Anesthesia method:  Local infiltration   Local anesthetic:  Lidocaine 1% w/o epi Laceration details:    Location:  Lip   Lip location:  Upper exterior lip   Length (cm):  1.5   Depth (mm):  3 Repair type:    Repair type:  Simple Pre-procedure details:    Preparation:  Patient was prepped and draped in usual sterile fashion and imaging obtained to evaluate for foreign bodies Exploration:    Hemostasis achieved  with:  Direct pressure   Wound exploration: wound explored through full range of motion and entire depth of wound probed and visualized     Wound extent: no foreign bodies/material noted, no muscle damage noted, no nerve damage noted, no tendon damage noted, no underlying fracture noted and no vascular damage noted     Contaminated: no   Treatment:    Area cleansed with:  Saline and Hibiclens   Amount of cleaning:  Extensive   Irrigation solution:  Sterile saline   Irrigation volume:    Irrigation method:  Pressure wash   Visualized foreign bodies/material removed: yes   Skin repair:    Repair method:  Sutures   Suture size:  7-0   Suture material:  Prolene   Suture technique:  Simple interrupted   Number of sutures:  3 Approximation:    Approximation:  Close   Vermilion border: well-aligned   Post-procedure details:    Dressing:  Antibiotic ointment, non-adherent dressing and sterile dressing   Patient tolerance of  procedure:  Tolerated well, no immediate complications Comments:     Patient and acquaintance at bedside expressed satisfaction with repair today.   (including critical care time)  Medications Ordered in ED Medications  oxyCODONE-acetaminophen (PERCOCET/ROXICET) 5-325 MG per tablet 1 tablet (1 tablet Oral Given 08/19/18 2017)  ondansetron (ZOFRAN-ODT) disintegrating tablet 4 mg (4 mg Oral Given 08/19/18 2017)  lidocaine (PF) (XYLOCAINE) 1 % injection 5 mL (5 mLs Infiltration Given 08/19/18 2305)     Initial Impression / Assessment and Plan / ED Course  I have reviewed the triage vital signs and the nursing notes.  Pertinent labs & imaging results that were available during my care of the patient were reviewed by me and considered in my medical decision making (see chart for details).  Clinical Course as of Aug 20 16  Fri Aug 19, 2018  2302 Case discussed with Dr. Madilyn Hook.  Recommends closure with Prolene.   [BM]    Clinical Course User Index [BM] Bill Salinas, PA-C   Terry Fields is a 32 y.o. female who presents to ED for laceration of the upper lip. Wound thoroughly cleaned in ED today. Wound explored and bottom of wound seen in a bloodless field. Laceration repaired as dictated above. Patient counseled on home wound care. Follow up with PCP/urgent care or return to ER for suture removal in 5 days. Patient was urged to return to the Emergency Department for worsening pain, swelling, expanding erythema especially if it streaks away from the affected area, fever, or for any additional concerns. Patient verbalized understanding of return precautions. All questions answered. Wound thoroughly cleaned, facial laceration with good blood supply.  Antibiotics not indicated at this time. Patient states her tetanus shot was 4 years ago. Patient with additional laceration to inner mucosa around left canine.  No active bleeding.  This laceration is small and not visible unless patient  manually everts her lip with her hands.  Discussed with Dr. Madilyn Hook, no intervention necessary at this time. Plan of care and laceration repair discussed with Dr. Madilyn Hook who agrees.  Patient with previous maxillofacial surgery approximately 15 years ago.  CT scan today shows 1 of the screws has backed out of the bone.  Patient has been informed of this and informed that she should follow-up with her surgeon, Dr. Monia Pouch for further evaluation. This has been discussed with Dr. Madilyn Hook who agrees.  At this time there does not appear to be any evidence of an  acute emergency medical condition and the patient appears stable for discharge with appropriate outpatient follow up. Diagnosis was discussed with patient who verbalizes understanding of care plan and is agreeable to discharge. I have discussed return precautions with patient and friend at bedside who verbalize understanding of return precautions. Patient strongly encouraged to follow-up with their PCP. All questions answered.   Patient's case discussed with Dr. Madilyn Hook who agrees with plan to discharge with follow-up.     Note: Portions of this report may have been transcribed using voice recognition software. Every effort was made to ensure accuracy; however, inadvertent computerized transcription errors may still be present.  Final Clinical Impressions(s) / ED Diagnoses   Final diagnoses:  Lip laceration, initial encounter    ED Discharge Orders    None       Elizabeth Palau 08/20/18 0019    Bill Salinas, PA-C 08/20/18 Maudie Mercury, MD 08/20/18 [id number]

## 2018-08-19 NOTE — ED Provider Notes (Signed)
Patient placed in Quick Look pathway, seen and evaluated   Chief Complaint: facial injury  HPI: Terry Fields is a 32 y.o. female who presents to the ED with facial pain. Pt was cutting a piece of wood with a table saw 1 hour ago and it kicked back and hit her.  Laceration to L side of upper lip.  patient c/o severe pain to the left side of the face. Patient c/o nausea. No LOC  ROS: Skin: laceration lip  Head: facial pain  Physical Exam:  BP (!) 121/95 (BP Location: Right Arm)   Pulse 77   Temp 99.5 F (37.5 C)   Resp 16   Ht 5' 3.5" (1.613 Fields)   Wt 65.8 kg   LMP 08/15/2018   SpO2 100%   BMI 25.28 kg/Fields    Gen: No distress  Neuro: Awake and Alert  Skin: laceration upper lip  Face: tenderness and contusion left side     Initiation of care has begun. The patient has been counseled on the process, plan, and necessity for staying for the completion/evaluation, and the remainder of the medical screening examination    Terry Fields, Terry Koltz M, NP 08/19/18 2014    Terry Fields, David Hsienta, MD 08/20/18 1348

## 2018-08-19 NOTE — ED Triage Notes (Signed)
Pt was cutting a piece of wood with a table saw 1 hour ago and it kicked back and hit her.  Laceration to L side of upper lip.  Bleeding controlled pta.  Ice pack on lip.

## 2018-08-20 NOTE — Discharge Instructions (Signed)
Please return to the Emergency Department for any new or worsening symptoms or if your symptoms do not improve. Please be sure to follow up with your Primary Care Physician as soon as possible regarding your visit today. If you do not have a Primary Doctor please use the resources below to establish one. You must have your wound rechecked and sutures removed in the next 5 days.  You may present to an urgent care, your primary care doctor or back here to the emergency department to have sutures removed and wound rechecked. 1 of the screws in your face has backed out of the bone.  It is important for you to follow-up with your surgeon, Dr. Monia Pouch for further evaluation of this.  I have attached his contact information to your discharge paperwork. Please keep the area clean and dry and monitor for signs of infection. Contact a health care provider if: You were given a tetanus shot and have swelling, severe pain, redness, or bleeding at the injection site. You have a fever. Your pain is not controlled with medicine. You have redness, swelling, or pain at your wound that is getting worse. You have fresh bleeding or pus coming from your wound. The edges of your wound break open. You develop swollen, tender glands in your throat. Get help right away if: Your face or the area under your jaw becomes swollen. You have trouble breathing or swallowing. Contact a health care provider if: You received a tetanus shot and you have swelling, severe pain, redness, or bleeding at the injection site. You have a fever. A wound that was closed breaks open. You notice a bad smell coming from the wound. You notice something coming out of the wound, such as wood or glass. Your pain is not controlled with medicine. You have increased redness, swelling, or pain at the site of your wound. You have fluid, blood, or pus coming from your wound. You notice a change in the color of your skin near your wound. You need to  change the dressing frequently due to fluid, blood, or pus draining from the wound. You develop a new rash. You develop numbness around the wound. Get help right away if: You develop severe swelling around the injury site. Your pain suddenly increases and is severe. You develop painful lumps near the wound or on skin that is anywhere on your body. You have a red streak going away from your wound. The wound is on your hand or foot and you cannot properly move a finger or toe. The wound is on your hand or foot and you notice that your fingers or toes look pale or bluish.  Do not take your medicine if  develop an itchy rash, swelling in your mouth or lips, or difficulty breathing.   RESOURCE GUIDE  Chronic Pain Problems: Contact [facility]  [phone] Patients need to be referred by their primary care doctor.  Insufficient Money for Medicine: Contact United Way:  call "211" or [facility] 458-090-8214.  No Primary Care Doctor: Call Health Connect  [phone] - can help you locate a primary care doctor that  accepts your insurance, provides certain services, etc. Physician Referral [phone]  Agencies that provide inexpensive medical care: [facility]  [phone] [facility]  [phone] [facility]  [phone] [facility]  [phone] [facility]  [phone] [facility]  [phone]  Medicaid-accepting [facility] Providers: [facility]- [address], [address]  [phone], Mon-Fri 9am-7pm, Sat 9am-1pm [facility]- [address], [address] [place]  [phone] [facility]- [address], [address] [place]  [phone] [facility]- [address] [place] [address]  [phone] Renaye RakersVeita Bland- [address], [address], [phone]  Only accepts [place] Access [place] patients after they have their name  applied to their  card  Self Pay (no insurance) in [place] [facility]: Sickle Cell Patients: Dr Willey BladeEric Dean, [facility]  [address], [phone] [facility]- [address]  [phone]       Redge Gainer-     [facility]- [address], [address]       -     [facility]- see information above (Speak to CitigroupPam H if you do not have insurance)       -  [facility]- [address], [phone]       -  [facility]- [address],  [phone]       -  [facility]- [address] [place] [address], [phone]       -  Dr Julio Sickssei-Bonsu-  [address], [address], [place], [phone]       -  [facility]- [address], [phone]       -  [facility]- [address] [place] [address], [phone], also [address], [phone]       -    [facility]- [address], [phone], 1st & 3rd Saturday   every month, 10am-1pm  1) Find a Doctor and Pay Out of Pocket Although you won't have to find out who is covered by your insurance plan, it is a good idea to ask around and get recommendations. You will then need to call the office and see if the doctor you have chosen will accept you as a new patient and what types of options they offer for patients who are self-pay. Some doctors offer discounts or will set up payment plans for their patients who do not have insurance, but you will need to ask so you aren't surprised when you get to your appointment.  2) Contact Your Local Health Department Not all health departments have doctors that can see patients for sick visits, but many do, so it is worth a call to see if yours does. If you don't know where your local health department is, you can check in your phone book. The CDC also has a tool to help you locate your state's health department, and many state websites also have listings of all of their local health departments.  3) Find a Walk-in Clinic If your illness is not likely  to be very severe or complicated, you may want to try a walk in clinic. These are popping up all over the country in pharmacies, drugstores, and shopping centers. They're usually staffed by nurse practitioners or physician assistants that have been trained to treat common illnesses and complaints. They're usually fairly quick and inexpensive. However, if you have serious medical issues or chronic medical problems, these are probably not your best option  STD Testing [facility] Department of [facility] [place], STD Clinic, [address], [place], phone [phone] or [phone].  Monday - Friday, call for an appointment. [facility] Department of [facility] [place], STD Clinic, [place], [place],  phone [phone] or [phone].  Monday - Friday, call for an appointment.  Abuse/Neglect: [facility] Child Abuse Hotline (567) 720-0443 [facility] Child Abuse Hotline 224-218-6120 (After Hours)  Emergency Shelter:  [facility] [phone]  Maternity Homes: Room at the [place] [facility] [phone] [facility] [phone]  MRSA Hotline #:   951 789 1744  [facility] Resources  [facility] [place]  United [employer] [facility] Dept. [address]                 [address]         [address] [place] [address]  Blondell Reveal Phone:  [phone]                                  Phone:  [phone]                   Phone:  [phone]  [facility], [phone] [facility] [place]- [phone]       -     [facility] in [place], [address]                                  [phone], [facility] Child Abuse Hotline 817-806-8698 or 206-821-0053 (After Hours)   Behavioral Health  Services  Substance Abuse Resources: [facility] Drug [facility]  [phone] [facility] [phone] [facility] [place] [phone] Floydene Flock [phone] Residential & Outpatient Substance Abuse Program  [phone]  Psychological Services: [facility]  [phone] [facility] Services  [phone] [facility], [phone] [place]. [address] [place], ACCESS LINE: 934-727-0620 or [phone], EntrepreneurLoan.[email or web address]  Dental Assistance  If unable to pay or uninsured, contact:  [facility] or [facility]. to become qualified for the adult dental clinic.  Patients with Medicaid: [facility] [phone] [address], [phone] [address] [phone]  If unable to pay, or uninsured, contact [facility] [phone]) or [facility] Department [phone] in [place], [phone] in [facility]) to become qualified for the adult dental clinic   Other Low-Cost Community Dental Services: [facility]- [address] [place], [place], [place], [address], [phone], Ext. [phone], 2nd and 4th Thursday of the month at 6:30am.  10 clients each day by appointment, can sometimes see walk-in patients if someone does not show for an appointment. [facility]- [address] [place], [place], [address], [phone] [facility] [address], [place], [place], [address], [phone] [facility] Health Department- [phone] [facility] Health Department- [phone] [facility] [phone]

## 2019-06-14 ENCOUNTER — Other Ambulatory Visit: Payer: Self-pay

## 2019-06-14 ENCOUNTER — Ambulatory Visit (HOSPITAL_COMMUNITY): Admission: EM | Admit: 2019-06-14 | Discharge: 2019-06-14 | Discharge status: Against Medical Advice | Payer: Self-pay

## 2019-11-09 ENCOUNTER — Other Ambulatory Visit: Payer: Self-pay

## 2019-11-09 ENCOUNTER — Emergency Department (HOSPITAL_COMMUNITY)
Admission: EM | Admit: 2019-11-09 | Discharge: 2019-11-09 | Disposition: A | Discharge status: Home / Self Care | Payer: Medicaid Other | Attending: Emergency Medicine | Admitting: Emergency Medicine

## 2019-11-09 ENCOUNTER — Encounter (HOSPITAL_COMMUNITY): Payer: Self-pay | Admitting: Emergency Medicine

## 2019-11-09 DIAGNOSIS — Z79899 Other long term (current) drug therapy: Secondary | ICD-10-CM | POA: Insufficient documentation

## 2019-11-09 DIAGNOSIS — T50904A Poisoning by unspecified drugs, medicaments and biological substances, undetermined, initial encounter: Secondary | ICD-10-CM | POA: Insufficient documentation

## 2019-11-09 DIAGNOSIS — F17210 Nicotine dependence, cigarettes, uncomplicated: Secondary | ICD-10-CM | POA: Insufficient documentation

## 2019-11-09 MED ORDER — SODIUM CHLORIDE 0.9 % IV BOLUS
1000.0000 mL | Freq: Once | INTRAVENOUS | Status: AC
  Administered 2019-11-09: 11:00:00 1000 mL via INTRAVENOUS

## 2019-11-09 MED ORDER — NALOXONE HCL 4 MG/0.1ML NA LIQD
1.0000 | Freq: Once | NASAL | Status: AC
  Administered 2019-11-09: 14:00:00 1 via NASAL
  Filled 2019-11-09: qty 4

## 2019-11-09 MED ORDER — NALOXONE HCL 0.4 MG/ML IJ SOLN
0.4000 mg | Freq: Once | INTRAMUSCULAR | Status: AC
  Administered 2019-11-09: 11:00:00 0.4 mg via INTRAVENOUS

## 2019-11-09 MED ORDER — NALOXONE HCL 0.4 MG/ML IJ SOLN
INTRAMUSCULAR | Status: AC
  Filled 2019-11-09: qty 1

## 2019-11-09 NOTE — Patient Outreach (Signed)
CPSS met with the patient in order to provide substance use recovery support and provide information for substance use recovery resources. Patient arrived at the [facility] as a result of a drug overdose. Patient denies a history of active substance use addiction. Patient states she smokes marijuana occassionally and is not interested in substance use treatment resources. CPSS still provided the patient with information for substance use recovery resources. Some of these resources include residential/outpatient substance use treatment center list, NA meeting list, and CPSS contact information. CPSS strongly encouraged the patient to follow up with CPSS if any further questions or any additional help with getting connected to substance use recovery resources.

## 2019-11-09 NOTE — ED Notes (Signed)
Narcan 0.04 mg given. Pt verbal and more alert within 1 minute. Pt c/o being tired and sleepy. Pt asked what had been ingested. Pt states she does not know. Will continue to monitor.

## 2019-11-09 NOTE — Discharge Instructions (Addendum)
There is help if you need it.  Please do not use dirty needles, this could cause you a severe infection to your skin, heart or spinal cord.  This could kill you or leave you permanently disabled.  There was a recent study done at [facility] that showed that the risk of death for someone that had unintentionally overdosed on narcotics was as high as 15% in the next year.  This is much higher than most every other medical condition.  [place] Solution to the Opioid Problem (GCSTOP) Fixed; mobile; peer-based Chase Holleman [phone] [email or web address] Fixed site exchange at [employer], Wednesdays (2-5pm) and Thursdays (4-8pm). [address] [place], [place] [address] Call or text to arrange mobile and peer exchange, Mondays (1-4pm) and Fridays (4-7pm). Serving [place]  

## 2019-11-09 NOTE — ED Provider Notes (Signed)
[facility] DEPT Provider Note   CSN: [id number] Arrival date & time: 11/09/19  6144     History Chief Complaint  Patient presents with  . Ingestion    Terry Fields is a 33 y.o. female.  33 yo F with a chief complaints of altered mental status.  The patient was apparently with one of her "friends "she had taken a white pill.  Then became unresponsive.  They moved her into the breezeway and called 911.  Patient is altered and unable to provide history.  Level 5 caveat.  Per EMS the patient had pinpoint pupils but was protecting her airway and not requiring oxygen so did not receive Narcan.  The history is provided by the patient.  Ingestion Pertinent negatives include no chest pain, no headaches and no shortness of breath.  Illness Severity:  Moderate Onset quality:  Gradual Duration:  2 hours Timing:  Constant Progression:  Unchanged Chronicity:  New Associated symptoms: no chest pain, no congestion, no fever, no headaches, no myalgias, no nausea, no rhinorrhea, no shortness of breath, no vomiting and no wheezing        Past Medical History:  Diagnosis Date  . Bronchitis   . Fibromyalgia     There are no problems to display for this patient.   Past Surgical History:  Procedure Laterality Date  . FACIAL FRACTURE SURGERY       OB History   No obstetric history on file.     No family history on file.  Social History   Tobacco Use  . Smoking status: Current Every Day Smoker    Types: Cigarettes  . Smokeless tobacco: Never Used  Substance Use Topics  . Alcohol use: Yes  . Drug use: Yes    Types: Marijuana    Home Medications Prior to Admission medications   Medication Sig Start Date End Date Taking? Authorizing Provider  acetaminophen (TYLENOL) 500 MG tablet Take 1,000-1,500 mg by mouth every 6 (six) hours as needed. pain     [provider]  clindamycin (CLEOCIN) 150 MG capsule Take 2 capsules (300 mg  total) by mouth 3 (three) times daily. May dispense as 150mg  capsules 07/05/17   Larene Pickett, PA-C  erythromycin ophthalmic ointment Place a 1/2 inch ribbon of ointment into the lower eyelid four times daily. 04/25/17   Ward, Ozella Almond, PA-C  HYDROcodone-homatropine [facility]) 5-1.5 MG/5ML syrup Take 5 mLs by mouth 2 (two) times daily as needed for cough. 12/08/15   Everlene Balls, MD  ibuprofen (ADVIL,MOTRIN) 200 MG tablet Take 800 mg by mouth every 6 (six) hours as needed. pain     [provider]  Multiple Vitamins-Minerals (ADULT GUMMY PO) Take 2 tablets by mouth daily.      [provider]  naproxen sodium (ANAPROX) 220 MG tablet Take 440 mg by mouth once.      [provider]    Allergies    Patient has no known allergies.  Review of Systems   Review of Systems  Constitutional: Positive for activity change. Negative for chills and fever.  HENT: Negative for congestion and rhinorrhea.   Eyes: Negative for redness and visual disturbance.  Respiratory: Negative for shortness of breath and wheezing.   Cardiovascular: Negative for chest pain and palpitations.  Gastrointestinal: Negative for nausea and vomiting.  Genitourinary: Negative for dysuria and urgency.  Musculoskeletal: Negative for arthralgias and myalgias.  Skin: Negative for pallor and wound.  Neurological: Negative for dizziness and headaches.  Physical Exam Updated Vital Signs BP (!) 110/55   Pulse 79   Temp 97.9 F (36.6 C) (Oral)   Resp 11   SpO2 97%   Physical Exam Vitals and nursing note reviewed.  Constitutional:      General: She is not in acute distress.    Appearance: She is well-developed. She is not diaphoretic.  HENT:     Head: Normocephalic and atraumatic.  Eyes:     Pupils: Pupils are equal, round, and reactive to light.  Cardiovascular:     Rate and Rhythm: Normal rate and regular rhythm.     Heart sounds: No murmur. No friction rub. No gallop.   Pulmonary:      Effort: Pulmonary effort is normal.     Breath sounds: No wheezing or rales.  Abdominal:     General: There is no distension.     Palpations: Abdomen is soft.     Tenderness: There is no abdominal tenderness.  Musculoskeletal:        General: No tenderness.     Cervical back: Normal range of motion and neck supple.  Skin:    General: Skin is warm and dry.  Neurological:     Mental Status: She is alert.     Comments: Sleepy, awakes to voice and hard shaking.  Not answering questions. Quickly falls back to sleep.  Psychiatric:        Behavior: Behavior normal.     ED Results / Procedures / Treatments   Labs (all labs ordered are listed, but only abnormal results are displayed) Labs Reviewed - No data to display  EKG None  Radiology No results found.  Procedures Procedures (including critical care time)  Medications Ordered in ED Medications  naloxone (NARCAN) nasal spray 4 mg/0.1 mL (has no administration in time range)  sodium chloride 0.9 % bolus 1,000 mL (0 mLs Intravenous Stopped 11/09/19 1235)  naloxone (NARCAN) injection 0.4 mg (0.4 mg Intravenous Given 11/09/19 1105)    ED Course  I have reviewed the triage vital signs and the nursing notes.  Pertinent labs & imaging results that were available during my care of the patient were reviewed by me and considered in my medical decision making (see chart for details).    MDM Rules/Calculators/A&P     CHA2DS2/VAS Stroke Risk Points      N/A >= 2 Points: High Risk  1 - 1.99 Points: Medium Risk  0 Points: Low Risk    A final score could not be computed because of missing components.: Last  Change: N/A     This score determines the patient's risk of having a stroke if the  patient has atrial fibrillation.      This score is not applicable to this patient. Components are not  calculated.                   33 yo F with likely an opiate overdose.  Patient has pinpoint pupils has a decreased respiratory rate.  She  is currently not having any issues protecting her airway and is not requiring oxygen.  We will continue to observe in the ED.  Patient had downtrending of her blood pressure.  Given a bolus of IV fluids then 0.04 of Narcan with significant improvement of her blood pressure and her mental status.  Patient denies narcotic abuse, denies illegal drug use.  She denies alcohol.  She is unsure what happened last night.  Unable to describe to me what exactly occurred.  I discussed with her that if she is given medicine to reverse opiates and they have improvement in the ED than those people if they continue the use that drug have a 10% chance of dying in the next year.  She was given Narcan to go home with.  Patient was able to ambulate independently.  This was over an hour after receiving Narcan.  Peers support consult.  Discharge home.  1:36 PM:  I have discussed the diagnosis/risks/treatment options with the patient and believe the pt to be eligible for discharge home to follow-up with PCP. We also discussed returning to the ED immediately if new or worsening sx occur. We discussed the sx which are most concerning (e.g., sudden worsening pain, fever, inability to tolerate by mouth) that necessitate immediate return. Medications administered to the patient during their visit and any new prescriptions provided to the patient are listed below.  Medications given during this visit Medications  naloxone (NARCAN) nasal spray 4 mg/0.1 mL (has no administration in time range)  sodium chloride 0.9 % bolus 1,000 mL (0 mLs Intravenous Stopped 11/09/19 1235)  naloxone (NARCAN) injection 0.4 mg (0.4 mg Intravenous Given 11/09/19 1105)     The patient appears reasonably screen and/or stabilized for discharge and I doubt any other medical condition or other [facility] requiring further screening, evaluation, or treatment in the ED at this time prior to discharge.   Final Clinical Impression(s) / ED Diagnoses Final diagnoses:   Drug overdose, undetermined intent, initial encounter    Rx / DC Orders ED Discharge Orders    None       Melene Plan, DO 11/09/19 1337

## 2019-11-09 NOTE — ED Notes (Signed)
ED Provider at bedside. 

## 2019-11-09 NOTE — ED Notes (Signed)
An After Visit Summary was printed and given to the patient. Discharge instructions given and no further quesitons at this time. Pt A&Ox4. Pt ambulatory. Pt verbal.

## 2019-11-09 NOTE — ED Triage Notes (Signed)
Per [facility] pt was picked up for OD on unknown white pill at friend's place where they left her in breezeway of the apartment building bc the caller stated that she was leaving. Pt has pinpoint pupils, responds to painful stimuli. Pt had HR in 40s and BP in 80s with EMS, given NS 500 ml in route.

## 2019-11-09 NOTE — ED Notes (Signed)
Made Dr Tyrone Nine aware pt Bp 82/51, verbal orders for NS 1 liter bolus and Narcan 0.04mg .

## 2020-01-07 ENCOUNTER — Other Ambulatory Visit: Payer: Self-pay

## 2020-01-07 ENCOUNTER — Emergency Department (HOSPITAL_COMMUNITY): Payer: Self-pay

## 2020-01-07 ENCOUNTER — Emergency Department (HOSPITAL_COMMUNITY)
Admission: EM | Admit: 2020-01-07 | Discharge: 2020-01-07 | Disposition: A | Discharge status: Home / Self Care | Payer: Self-pay | Attending: Emergency Medicine | Admitting: Emergency Medicine

## 2020-01-07 DIAGNOSIS — S60511A Abrasion of right hand, initial encounter: Secondary | ICD-10-CM | POA: Insufficient documentation

## 2020-01-07 DIAGNOSIS — S51852A Open bite of left forearm, initial encounter: Secondary | ICD-10-CM | POA: Insufficient documentation

## 2020-01-07 DIAGNOSIS — Y92003 Bedroom of unspecified non-institutional (private) residence as the place of occurrence of the external cause: Secondary | ICD-10-CM | POA: Insufficient documentation

## 2020-01-07 DIAGNOSIS — S51851A Open bite of right forearm, initial encounter: Secondary | ICD-10-CM | POA: Insufficient documentation

## 2020-01-07 DIAGNOSIS — Y999 Unspecified external cause status: Secondary | ICD-10-CM | POA: Insufficient documentation

## 2020-01-07 DIAGNOSIS — Y9389 Activity, other specified: Secondary | ICD-10-CM | POA: Insufficient documentation

## 2020-01-07 DIAGNOSIS — W540XXA Bitten by dog, initial encounter: Secondary | ICD-10-CM | POA: Insufficient documentation

## 2020-01-07 MED ORDER — HYDROCODONE-ACETAMINOPHEN 5-325 MG PO TABS
1.0000 | ORAL_TABLET | Freq: Once | ORAL | Status: AC
  Administered 2020-01-07: 08:00:00 1 via ORAL
  Filled 2020-01-07: qty 1

## 2020-01-07 MED ORDER — AMOXICILLIN-POT CLAVULANATE 875-125 MG PO TABS: 1.0000 | ORAL_TABLET | Freq: Two times a day (BID) | ORAL | 0 refills | Status: AC

## 2020-01-07 NOTE — ED Triage Notes (Signed)
Pt presents to ED via [facility] from home. Pt states she was bitten by her dog as she was trying to get it out from under the bed after it had gotten fixed yesterday. EMS reports that fire had bandaged bite PTA but there were 3 small puncture wounds to her left wrist, bleeding controlled with fires bandage. EMS attempted to contact animal control but no answer.

## 2020-01-07 NOTE — ED Notes (Addendum)
Patient came up to this RN at nursing station and wanted to sign out "AMA" this RN looked and saw patient was up for discharge. Had patient sign out, while [place], Charity fundraiser gave patient and explained discharge orders. Patient was unhappy with the lack of Pain medicine she received.

## 2020-01-07 NOTE — Discharge Instructions (Signed)
Please return for any problem.  Follow-up with your regular care provider as instructed.  Use ice and elevation for control of your pain.  Tylenol would also be helpful.  Please complete the prescription of antibiotics as prescribed.

## 2020-01-07 NOTE — ED Notes (Signed)
Patient refused wound care. Patient left upset because we did not give her strong pain medication. Patient state Vicodin was just like giving her tylenol and she could have take tylenol at home if she knew she was going to get Vicodin

## 2020-01-07 NOTE — ED Notes (Addendum)
Pt expressed when she gets home "I'm gonna shoot that dog."

## 2020-01-07 NOTE — ED Provider Notes (Signed)
[facility]-EMERGENCY DEPT Provider Note   CSN: [id number] Arrival date & time: 01/07/20  6269     History Chief Complaint  Patient presents with  . Animal Bite    Terry Fields is a 34 y.o. female.  34 year old female with prior medical history as detailed below presents for evaluation of dog bite.  Patient reports that she was bitten by her fiancs dog as she was trying to remove the dog from underneath the bed.  This occurred approximately 45 minutes prior to arrival.  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  She reports that her tetanus shot is up-to-date.  The dog apparently is up-to-date on its shots and was recently fixed.  The patient thinks that this is the reason for the dog biting her this morning.  She is complaining of pain to both forearms where she has multiple small abrasions and puncture wounds.  She has not taken anything for the pain.  She is otherwise without specific complaint.  The dog apparently was approximately 30 pounds.  The history is provided by the patient and medical records.  Animal Bite Contact animal:  Dog Location:  Shoulder/arm and hand Shoulder/arm injury location:  L arm and R arm Hand injury location:  R hand Pain details:    Quality:  Aching   Severity:  Mild   Timing:  Constant Provoked: unprovoked   Notifications:  None Animal's rabies vaccination status:  Up to date Animal in possession: yes   Tetanus status:  Up to date Relieved by:  Nothing      Past Medical History:  Diagnosis Date  . Bronchitis   . Fibromyalgia     There are no problems to display for this patient.   Past Surgical History:  Procedure Laterality Date  . FACIAL FRACTURE SURGERY       OB History   No obstetric history on file.     No family history on file.  Social History   Tobacco Use  . Smoking status: Current Every Day Smoker    Types: Cigarettes  . Smokeless tobacco: Never Used  Substance Use  Topics  . Alcohol use: Yes  . Drug use: Yes    Types: Marijuana    Home Medications Prior to Admission medications   Medication Sig Start Date End Date Taking? Authorizing Provider  acetaminophen (TYLENOL) 500 MG tablet Take 1,000-1,500 mg by mouth every 6 (six) hours as needed. pain     [provider]  ibuprofen (ADVIL,MOTRIN) 200 MG tablet Take 800 mg by mouth every 6 (six) hours as needed. pain     [provider]    Allergies    Sulfa antibiotics  Review of Systems   Review of Systems  All other systems reviewed and are negative.   Physical Exam Updated Vital Signs BP 110/69 (BP Location: Right Arm)   Pulse 80   Temp (!) 97.3 F (36.3 C) (Oral)   Resp (!) 21   Ht 5\' 3"  (1.6 m)   Wt 59 kg   LMP 12/24/2019   SpO2 99%   BMI 23.03 kg/m   Physical Exam Vitals and nursing note reviewed.  Constitutional:      General: She is not in acute distress.    Appearance: Normal appearance. She is well-developed.  HENT:     Head: Normocephalic and atraumatic.  Eyes:     Conjunctiva/sclera: Conjunctivae normal.     Pupils: Pupils are equal, round, and reactive  to light.  Cardiovascular:     Rate and Rhythm: Normal rate and regular rhythm.     Heart sounds: Normal heart sounds.  Pulmonary:     Effort: Pulmonary effort is normal. No respiratory distress.     Breath sounds: Normal breath sounds.  Abdominal:     General: There is no distension.     Palpations: Abdomen is soft.     Tenderness: There is no abdominal tenderness.  Musculoskeletal:        General: No deformity. Normal range of motion.     Cervical back: Normal range of motion and neck supple.  Skin:    General: Skin is warm and dry.     Comments: Multiple small abrasions to the left forearm and right forearm and right hand.  Small puncture wound at the mid left forearm without active bleeding.  Small puncture wounds to the right mid forearm without active bleeding.  Both upper extremities are  neurovascular intact distally.  No significant open laceration noted.  Active range of motion of both upper extremities is intact.  Neurological:     General: No focal deficit present.     Mental Status: She is alert and oriented to person, place, and time. Mental status is at baseline.     ED Results / Procedures / Treatments   Labs (all labs ordered are listed, but only abnormal results are displayed) Labs Reviewed - No data to display  EKG None  Radiology DG Forearm Left  Result Date: 01/07/2020 CLINICAL DATA:  Dog bite. EXAM: LEFT FOREARM - 2 VIEW COMPARISON:  None. FINDINGS: There is no evidence of fracture or other focal bone lesions. Soft tissues are unremarkable. No radiopaque foreign body is noted. IMPRESSION: Negative. Electronically Signed   By: Marijo Conception M.D.   On: 01/07/2020 08:24   DG Forearm Right  Result Date: 01/07/2020 CLINICAL DATA:  Status post dog bite. EXAM: RIGHT FOREARM - 2 VIEW COMPARISON:  None. FINDINGS: There is no evidence of fracture or other focal bone lesions. Soft tissues are unremarkable. No radiopaque foreign body is noted. IMPRESSION: Negative. Electronically Signed   By: Marijo Conception M.D.   On: 01/07/2020 08:24   DG Hand 2 View Right  Result Date: 01/07/2020 CLINICAL DATA:  Status post dog bite. EXAM: RIGHT HAND - 2 VIEW COMPARISON:  None. FINDINGS: There is no evidence of fracture or dislocation. There is no evidence of arthropathy or other focal bone abnormality. Soft tissues are unremarkable. IMPRESSION: Negative. Electronically Signed   By: Marijo Conception M.D.   On: 01/07/2020 08:22    Procedures Procedures (including critical care time)  Medications Ordered in ED Medications  HYDROcodone-acetaminophen (NORCO/VICODIN) 5-325 MG per tablet 1 tablet (1 tablet Oral Given 01/07/20 0747)    ED Course  I have reviewed the triage vital signs and the nursing notes.  Pertinent labs & imaging results that were available during my care of  the patient were reviewed by me and considered in my medical decision making (see chart for details).    MDM Rules/Calculators/A&P                      MDM  Screen complete  Terry Fields was evaluated in Emergency Department on 01/07/2020 for the symptoms described in the history of present illness. She was evaluated in the context of the global COVID-19 pandemic, which necessitated consideration that the patient might be at risk for infection with the SARS-CoV-2 virus  that causes COVID-19. Institutional protocols and algorithms that pertain to the evaluation of patients at risk for COVID-19 are in a state of rapid change based on information released by regulatory bodies including the CDC and federal and state organizations. These policies and algorithms were followed during the patient's care in the ED.  Patient is presenting for evaluation of injuries related to a dog bite from her fianc's dog.  Patient with multiple puncture wounds to both forearms and right hand.  No significant laceration requiring suturing present.  Plain films did not demonstrate fracture or retained foreign body.  Patient was given 1 Norco for pain control.  She does not feel that this is adequate.  She is requesting Roxicodone for treatment of her pain.  Patient is advised that additional narcotics would not be indicated or appropriate.  Patient will be discharged.  Prescription provided for Augmentin and patient is encouraged to take.  Importance of close follow-up is stressed.  Strict return precautions given and understood.   Final Clinical Impression(s) / ED Diagnoses Final diagnoses:  Dog bite, initial encounter    Rx / DC Orders ED Discharge Orders         Ordered    amoxicillin-clavulanate (AUGMENTIN) 875-125 MG tablet  2 times daily     01/07/20 0849           Wynetta Fines, MD 01/07/20 [phone]

## 2020-02-29 DEATH — deceased
